# Patient Record
Sex: Male | Born: 1955 | Race: White | Hispanic: No | Marital: Married | Smoking: Never smoker
Health system: Southern US, Community
[De-identification: ages and names within clinical notes are randomized; demographics above are authoritative.]

## PROBLEM LIST (undated history)

## (undated) DIAGNOSIS — Z789 Other specified health status: Secondary | ICD-10-CM

## (undated) HISTORY — PX: NOSE SURGERY: SHX723

## (undated) HISTORY — PX: NASAL POLYP SURGERY: SHX186

---

## 2007-05-22 ENCOUNTER — Ambulatory Visit: Payer: Self-pay | Admitting: Emergency Medicine

## 2011-02-08 ENCOUNTER — Ambulatory Visit: Payer: Self-pay | Admitting: Internal Medicine

## 2017-07-06 DIAGNOSIS — H10412 Chronic giant papillary conjunctivitis, left eye: Secondary | ICD-10-CM | POA: Diagnosis not present

## 2017-10-07 DIAGNOSIS — Z0000 Encounter for general adult medical examination without abnormal findings: Secondary | ICD-10-CM | POA: Diagnosis not present

## 2017-10-07 DIAGNOSIS — Z23 Encounter for immunization: Secondary | ICD-10-CM | POA: Diagnosis not present

## 2017-10-07 DIAGNOSIS — Z1159 Encounter for screening for other viral diseases: Secondary | ICD-10-CM | POA: Diagnosis not present

## 2017-10-07 DIAGNOSIS — Z7689 Persons encountering health services in other specified circumstances: Secondary | ICD-10-CM | POA: Diagnosis not present

## 2017-10-10 ENCOUNTER — Encounter: Payer: Self-pay | Admitting: Emergency Medicine

## 2017-10-10 ENCOUNTER — Ambulatory Visit
Admission: EM | Admit: 2017-10-10 | Discharge: 2017-10-10 | Disposition: A | Discharge status: Home / Self Care | Payer: 59 | Attending: Emergency Medicine | Admitting: Emergency Medicine

## 2017-10-10 ENCOUNTER — Ambulatory Visit
Admission: RE | Admit: 2017-10-10 | Discharge: 2017-10-10 | Disposition: A | Discharge status: Home / Self Care | Payer: 59 | Source: Ambulatory Visit | Attending: Emergency Medicine | Admitting: Emergency Medicine

## 2017-10-10 DIAGNOSIS — S0990XA Unspecified injury of head, initial encounter: Secondary | ICD-10-CM

## 2017-10-10 DIAGNOSIS — S022XXA Fracture of nasal bones, initial encounter for closed fracture: Secondary | ICD-10-CM | POA: Diagnosis not present

## 2017-10-10 DIAGNOSIS — W108XXA Fall (on) (from) other stairs and steps, initial encounter: Secondary | ICD-10-CM | POA: Diagnosis not present

## 2017-10-10 DIAGNOSIS — S01112A Laceration without foreign body of left eyelid and periocular area, initial encounter: Secondary | ICD-10-CM

## 2017-10-10 DIAGNOSIS — W19XXXA Unspecified fall, initial encounter: Secondary | ICD-10-CM

## 2017-10-10 DIAGNOSIS — R51 Headache: Secondary | ICD-10-CM | POA: Diagnosis not present

## 2017-10-10 DIAGNOSIS — S0181XA Laceration without foreign body of other part of head, initial encounter: Secondary | ICD-10-CM | POA: Diagnosis not present

## 2017-10-10 DIAGNOSIS — X58XXXA Exposure to other specified factors, initial encounter: Secondary | ICD-10-CM | POA: Diagnosis not present

## 2017-10-10 NOTE — ED Provider Notes (Signed)
HPI  SUBJECTIVE:  Antonio Montgomery is a 61 y.o. male who presents with laceration to his left eyebrow.  States that he had 6-7 beers, and tripped on a trash bag that he was taking out, states that he fell 3 steps down and hit his head on the concrete.  This occurred approximately 20-30 last night.  Unknown loss of consciousness, but he did defecate on himself.  He reports a headache and states that he feels "unsteady on my feet".  He tried a Band-Aid with no improvement in symptoms.  No aggravating factors.  No nausea, vomiting, photophobia.  No chest pain, shortness of breath, palpitations, syncope causing his fall.  No face droop, arm or leg weakness, neck pain, visual changes.  No aphasia, dysarthria.  No nasal congestion, rhinorrhea.  No amnesia before the impact.  He is a past medical history negative for concussion, diabetes, hypertension, atrial fibrillation, arrhythmia, syncope, anticoagulant antiplatelet use, coagulopathy.  PMD.Gauger, Victoriano Lain, NP   History reviewed. No pertinent past medical history.  Past Surgical History:  Procedure Laterality Date  . NOSE SURGERY      History reviewed. No pertinent family history.  Social History  Substance Use Topics  . Smoking status: Never Smoker  . Smokeless tobacco: Never Used  . Alcohol use Yes    No current facility-administered medications for this encounter.  No current outpatient prescriptions on file.  No Known Allergies   ROS  As noted in HPI.   Physical Exam  BP (!) 137/96 (BP Location: Right Arm)   Pulse 66   Temp 98.3 F (36.8 C) (Oral)   Resp 16   Ht 5\' 10"  (1.778 m)   Wt 167 lb 1.7 oz (75.8 kg)   SpO2 100%   BMI 23.98 kg/m   Constitutional: Well developed, well nourished, no acute distress Eyes: PERRLA, EOMI, conjunctiva normal bilaterally.  No direct or consensual photophobia.  HENT: Normocephalic, 3 cm abrasion/laceration left eyebrow.,mucus membranes moist.  No raccoon eyes, hemotympanum,  nasal  congestion/rhinorrhea, negative battle sign    Respiratory: Normal inspiratory effort Cardiovascular: Normal rate regular rhythm no murmurs rubs or gallops. GI: nondistended skin: No rash, skin intact Musculoskeletal: no deformities Neurologic: Alert & oriented x 3, no focal neuro deficits, GCS 15.  Cranial nerves II through XII intact.  Finger-nose, heel shin within normal limits.  Tandem gait steady.  Romberg negative.   Psychiatric: Speech and behavior appropriate   ED Course   Medications - No data to display  Orders Placed This Encounter  Procedures  . CT HEAD WO CONTRAST    Standing Status:   Future    Number of Occurrences:   1    Standing Expiration Date:   01/10/2019    Order Specific Question:   Preferred imaging location?    Answer:   [facility]    Order Specific Question:   Radiology Contrast Protocol - do NOT remove file path    Answer:   \\[email or web address]    No results found for this or any previous visit (from the past 24 hour(s)). Ct Head Wo Contrast  Result Date: 10/10/2017 CLINICAL DATA:  Patient states that he fell down some steps while taking the trash last night around 10:30pm. Patient states that he hit his head on the cement. Patient has a laceration to his left eyebrow. Patient is unsure if he had LOC. EXAM: CT HEAD WITHOUT CONTRAST TECHNIQUE: Contiguous axial images were obtained from the base of the skull through the vertex without  intravenous contrast. COMPARISON:  None. FINDINGS: Brain: No evidence of acute infarction, hemorrhage, hydrocephalus, extra-axial collection or mass lesion/mass effect. Vascular: No hyperdense vessel or unexpected calcification. Skull: No skull fracture. Sinuses/Orbits: Laceration of the soft tissues along the medial margin of the left orbit, to the right of the base of the nose. No radiopaque foreign body. No injury to the left globe or postseptal orbit. Normal right globe and orbit. There changes  from prior sinus surgery. There is extensive sinus disease. The ethmoid air cells are opacified. There is moderate mucosal thickening in the frontal sinuses and maxillary sinuses with mild sphenoid sinus mucosal thickening. Clear mastoid air cells and middle ear cavities. Other: There is a fracture along the right anterior aspect of the nasal bone. No other visualized fractures. IMPRESSION: 1. No intracranial abnormality. 2. Nondisplaced fracture of the right anterior nasal bone. 3. Extensive sinus mucosal thickening. Status post prior sinus surgery. Electronically Signed   By: Lajean Manes M.D.   On: 10/10/2017 11:28    ED Clinical Impression  Facial laceration, initial encounter  Fall, initial encounter  Injury of head, initial encounter   ED Assessment/Plan  Procedure note: Scrubbed laceration with wound cleanse solution and gauze.  Then irrigated it.  Using 0.5 cc of 2% lidocaine with epi for local infiltration with adequate anesthesia.  Placed 4 6-0 Prolene interrupted sutures.  Placed bacitracin and dressing.  Patient tolerated procedure well.  Advised to come back here in 10 days for suture removal, sooner for any signs of infection.  Will obtain outpatient head CT because he was intoxicated, had a questionable loss of consciousness.  He also reports unsteadiness although he is neurologically intact.  Imaging independently reviewed.  No intracranial abnormality, nondisplaced fracture of the right anterior nasal bone, extensive sinus mucosal disease/thickening.  See radiology report for details.    Discussed  imaging, MDM, plan and followup with patient.  advised him to follow-up with his ENT physician for the sinus disease.   Discussed sn/sx that should prompt return to the ED. patient agrees with plan.   No orders of the defined types were placed in this encounter.   *This clinic note was created using Dragon dictation software. Therefore, there may be occasional mistakes despite  careful proofreading.   ?   Melynda Ripple, MD 10/10/17 [phone]

## 2017-10-10 NOTE — Discharge Instructions (Signed)
Wait at the radiology department until the head CT is read.  Return here in 10 days for suture removal.  Keep it covered and clean and dry for the next 48-72 hours, then you can leave it open to air.  Apply bacitracin as needed.  Return here sooner for any signs of infection.  Go immediately to the ER if your headache worsens, you have trouble talking, walking, arm or leg weakness, facial droop, visual changes, lights bothering your eyes or for other concerns

## 2017-10-10 NOTE — ED Triage Notes (Signed)
Patient states that he fell down some steps while taking the trash last night around 10:30pm.  Patient states that he hit his head on the cement.  Patient has a laceration to his left eyebrow.  Patient is unsure if he had LOC.

## 2017-10-12 ENCOUNTER — Telehealth: Payer: Self-pay

## 2017-10-12 NOTE — ED Notes (Signed)
CT Head W/O Contrast 70450 prior authorization obtained. Approval Dates 10/10/2017-11/24/2017. PA # [id number]

## 2017-10-20 ENCOUNTER — Other Ambulatory Visit: Payer: Self-pay

## 2017-10-20 ENCOUNTER — Ambulatory Visit
Admission: EM | Admit: 2017-10-20 | Discharge: 2017-10-20 | Disposition: A | Discharge status: Home / Self Care | Payer: 59 | Attending: Family Medicine | Admitting: Family Medicine

## 2017-10-20 ENCOUNTER — Encounter: Payer: Self-pay | Admitting: Emergency Medicine

## 2017-10-20 DIAGNOSIS — S01112D Laceration without foreign body of left eyelid and periocular area, subsequent encounter: Secondary | ICD-10-CM | POA: Diagnosis not present

## 2017-10-20 DIAGNOSIS — Z4802 Encounter for removal of sutures: Secondary | ICD-10-CM | POA: Diagnosis not present

## 2017-10-20 HISTORY — DX: Other specified health status: Z78.9

## 2017-10-20 NOTE — ED Provider Notes (Signed)
61 year old male presents for suture removal.  Patient's wound was repaired on 11/3.  He endorses no complications.  No drainage from the wound.  Sutures removed today without difficulty.  Girard, [place], [place] 10/20/17 [phone]

## 2017-10-20 NOTE — ED Triage Notes (Signed)
Patient in today for suture removal left eyebrow. DOI 10/09/17.

## 2018-01-11 DIAGNOSIS — Z1211 Encounter for screening for malignant neoplasm of colon: Secondary | ICD-10-CM | POA: Diagnosis not present

## 2018-01-22 DIAGNOSIS — K620 Anal polyp: Secondary | ICD-10-CM | POA: Diagnosis not present

## 2018-01-22 DIAGNOSIS — K635 Polyp of colon: Secondary | ICD-10-CM | POA: Diagnosis not present

## 2018-01-22 DIAGNOSIS — K621 Rectal polyp: Secondary | ICD-10-CM | POA: Diagnosis not present

## 2018-01-22 DIAGNOSIS — Z1211 Encounter for screening for malignant neoplasm of colon: Secondary | ICD-10-CM | POA: Diagnosis not present

## 2018-01-22 DIAGNOSIS — D126 Benign neoplasm of colon, unspecified: Secondary | ICD-10-CM | POA: Diagnosis not present

## 2018-02-10 DIAGNOSIS — J339 Nasal polyp, unspecified: Secondary | ICD-10-CM | POA: Diagnosis not present

## 2018-02-10 DIAGNOSIS — J3489 Other specified disorders of nose and nasal sinuses: Secondary | ICD-10-CM | POA: Diagnosis not present

## 2018-02-10 DIAGNOSIS — J301 Allergic rhinitis due to pollen: Secondary | ICD-10-CM | POA: Diagnosis not present

## 2018-02-25 DIAGNOSIS — J3489 Other specified disorders of nose and nasal sinuses: Secondary | ICD-10-CM | POA: Diagnosis not present

## 2018-02-25 DIAGNOSIS — J301 Allergic rhinitis due to pollen: Secondary | ICD-10-CM | POA: Diagnosis not present

## 2018-02-25 DIAGNOSIS — J339 Nasal polyp, unspecified: Secondary | ICD-10-CM | POA: Diagnosis not present

## 2018-03-16 DIAGNOSIS — J301 Allergic rhinitis due to pollen: Secondary | ICD-10-CM | POA: Diagnosis not present

## 2020-05-27 ENCOUNTER — Other Ambulatory Visit: Payer: Self-pay

## 2020-05-27 ENCOUNTER — Ambulatory Visit
Admission: EM | Admit: 2020-05-27 | Discharge: 2020-05-27 | Disposition: A | Discharge status: Home / Self Care | Payer: BC Managed Care – PPO | Attending: Emergency Medicine | Admitting: Emergency Medicine

## 2020-05-27 DIAGNOSIS — W57XXXA Bitten or stung by nonvenomous insect and other nonvenomous arthropods, initial encounter: Secondary | ICD-10-CM | POA: Diagnosis not present

## 2020-05-27 DIAGNOSIS — T63441A Toxic effect of venom of bees, accidental (unintentional), initial encounter: Secondary | ICD-10-CM

## 2020-05-27 DIAGNOSIS — T7840XA Allergy, unspecified, initial encounter: Secondary | ICD-10-CM | POA: Diagnosis not present

## 2020-05-27 MED ORDER — TETANUS-DIPHTH-ACELL PERTUSSIS 5-2.5-18.5 LF-MCG/0.5 IM SUSP: 0.5000 mL | Freq: Once | INTRAMUSCULAR | Status: DC

## 2020-05-27 MED ORDER — PREDNISONE 10 MG (21) PO TBPK: ORAL_TABLET | ORAL | 0 refills | Status: AC

## 2020-05-27 NOTE — Discharge Instructions (Addendum)
-  Take an over-the-counter allergy medication such as Zyrtec or Xyzal every day -Take an over-the-counter H2 receptor blocker such as Pepcid or Zantac every day -Cold compresses to the area -Monitor the redness and swelling of the area. If the redness and swelling does not get better with all the above medications and your prednisone, follow-up to rule out infection.

## 2020-05-27 NOTE — ED Provider Notes (Addendum)
[facility] - [place], [place]   Name: Antonio Montgomery DOB: 23-Feb-1956 MRN: [id number] CSN: [id number] PCP: Sallee Lange, NP  Arrival date and time:  05/27/20 0816  Chief Complaint:  Insect Bite   NOTE: Prior to seeing the patient today, I have reviewed the triage nursing documentation and vital signs. Clinical staff has updated patient's PMH/PSHx, current medication list, and drug allergies/intolerances to ensure comprehensive history available to assist in medical decision making.   History:   HPI: Antonio Montgomery is a 64 y.o. male who presents today with complaints of insect bites.  He was stung by yellow jackets on Friday after missing a nest.  He was stung in both hands, arms, and his right side.  He states he never had another reaction to yellowjacket before, but this time these bites caused an increase amount of swelling and itching.  He denies any shortness of breath, chest pain, problems breathing.  He has tried over-the-counter medications such as Benadryl, topical corticosteroid cream, and calmoseptine lotion.  He states that all these things give him relief, but for a very short period of time.  Last tetanus shot was in 2019 via MyChart.   Past Medical History:  Diagnosis Date  . No known health problems     Past Surgical History:  Procedure Laterality Date  . NASAL POLYP SURGERY    . NOSE SURGERY      Family History  Problem Relation Age of Onset  . Hypertension Mother   . Thyroid disease Mother   . Hypertension Father     Social History   Tobacco Use  . Smoking status: Never Smoker  . Smokeless tobacco: Never Used  Vaping Use  . Vaping Use: Never used  Substance Use Topics  . Alcohol use: Yes    Alcohol/week: 15.0 standard drinks    Types: 15 Cans of beer per week  . Drug use: No    There are no problems to display for this patient.   Home Medications:    No outpatient medications have been marked as taking for the 05/27/20  encounter [facility] Encounter).    Allergies:   Patient has no known allergies.  Review of Systems (ROS): Review of Systems  Constitutional: Negative for chills and fever.  Eyes: Negative for pain and visual disturbance.  Respiratory: Negative for cough and shortness of breath.   Cardiovascular: Negative for chest pain and palpitations.  Skin: Positive for color change and rash.  All other systems reviewed and are negative.    Vital Signs: Today's Vitals   05/27/20 0838 05/27/20 0839 05/27/20 0858  BP:  (!) 148/81   Pulse:  (!) 59   Resp:  18   Temp:  97.9 F (36.6 C)   TempSrc:  Oral   SpO2:  100%   Weight: 160 lb (72.6 kg)    Height: 5\' 10"  (1.778 m)    PainSc: 0-No pain  0-No pain    Physical Exam: Physical Exam Constitutional:      Appearance: Normal appearance.  Cardiovascular:     Pulses: Normal pulses.          Radial pulses are 2+ on the right side and 2+ on the left side.     Heart sounds: Normal heart sounds.  Pulmonary:     Effort: Pulmonary effort is normal.     Breath sounds: Normal breath sounds.  Skin:    Capillary Refill: Capillary refill takes less than 2 seconds.  Findings: Rash present.       Neurological:     Mental Status: He is alert.      Urgent Care Treatments / Results:   LABS: PLEASE NOTE: all labs that were ordered this encounter are listed, however only abnormal results are displayed. Labs Reviewed - No data to display  EKG: -None  RADIOLOGY: No results found.  PROCEDURES: Procedures  MEDICATIONS RECEIVED THIS VISIT: Medications - No data to display  PERTINENT CLINICAL COURSE NOTES/UPDATES:   Initial Impression / Assessment and Plan / Urgent Care Course:  Pertinent labs & imaging results that were available during my care of the patient were personally reviewed by me and considered in my medical decision making (see lab/imaging section of note for values and interpretations).  Antonio Montgomery is a 64 y.o. male  who presents to [facility] today with complaints of a rash, diagnosed with an insect bite, and treated as such with the medications below. NP and patient reviewed discharge instructions below during visit.   Patient is well appearing overall in clinic today. He does not appear to be in any acute distress. Presenting symptoms (see HPI) and exam as documented above.   I have reviewed the follow up and strict return precautions for any new or worsening symptoms. Patient is aware of symptoms that would be deemed urgent/emergent, and would thus require further evaluation either here or in the emergency department. At the time of discharge, he verbalized understanding and consent with the discharge plan as it was reviewed with him. All questions were fielded by provider and/or clinic staff prior to patient discharge.    Final Clinical Impressions / Urgent Care Diagnoses:   Final diagnoses:  Insect bite, unspecified site, initial encounter  Bee sting reaction, accidental or unintentional, initial encounter  Allergic reaction, initial encounter    New Prescriptions:  [place] Controlled Substance Registry consulted? Not Applicable  Meds ordered this encounter  Medications  . DISCONTD: Tdap (BOOSTRIX) injection 0.5 mL  . predniSONE (STERAPRED UNI-PAK 21 TAB) 10 MG (21) TBPK tablet    Sig: Take as instructed on package (60, 50, 40, 30, 20, 10)    Dispense:  1 each    Refill:  0      Discharge Instructions     -Take an over-the-counter allergy medication such as Zyrtec or Xyzal every day -Take an over-the-counter H2 receptor blocker such as Pepcid or Zantac every day -Cold compresses to the area -Monitor the redness and swelling of the area. If the redness and swelling does not get better with all the above medications and your prednisone, follow-up to rule out infection.    Recommended Follow up Care:  Patient encouraged to follow up with the following provider within the specified  time frame, or sooner as dictated by the severity of his symptoms. As always, he was instructed that for any urgent/emergent care needs, he should seek care either here or in the emergency department for more immediate evaluation.   Gertie Baron, DNP, NP-c    Gertie Baron, NP 05/27/20 6962    Gertie Baron, NP 06/09/20 9528

## 2020-05-27 NOTE — ED Triage Notes (Signed)
Patient states that he was stung by yellow jackets on Friday on his hand and axilla. Patient states that he didn't know he was sitting over a nest. Reports that the area has been itchy.

## 2020-08-13 ENCOUNTER — Ambulatory Visit
Admission: EM | Admit: 2020-08-13 | Discharge: 2020-08-13 | Disposition: A | Discharge status: Home / Self Care | Payer: BC Managed Care – PPO | Attending: Physician Assistant | Admitting: Physician Assistant

## 2020-08-13 ENCOUNTER — Ambulatory Visit (INDEPENDENT_AMBULATORY_CARE_PROVIDER_SITE_OTHER): Payer: BC Managed Care – PPO

## 2020-08-13 ENCOUNTER — Other Ambulatory Visit: Payer: Self-pay

## 2020-08-13 DIAGNOSIS — B349 Viral infection, unspecified: Secondary | ICD-10-CM | POA: Diagnosis not present

## 2020-08-13 DIAGNOSIS — R52 Pain, unspecified: Secondary | ICD-10-CM | POA: Diagnosis not present

## 2020-08-13 DIAGNOSIS — J029 Acute pharyngitis, unspecified: Secondary | ICD-10-CM | POA: Diagnosis not present

## 2020-08-13 DIAGNOSIS — R5383 Other fatigue: Secondary | ICD-10-CM

## 2020-08-13 DIAGNOSIS — R05 Cough: Secondary | ICD-10-CM | POA: Diagnosis not present

## 2020-08-13 DIAGNOSIS — U071 COVID-19: Secondary | ICD-10-CM | POA: Insufficient documentation

## 2020-08-13 LAB — URINALYSIS, COMPLETE (UACMP) WITH MICROSCOPIC
Glucose, UA: NEGATIVE mg/dL
Hgb urine dipstick: NEGATIVE
Leukocytes,Ua: NEGATIVE
Nitrite: NEGATIVE
Protein, ur: 100 mg/dL — AB
Specific Gravity, Urine: >1.03 — ABNORMAL HIGH (ref 1.005–1.030)
Squamous Epithelial / HPF: NONE SEEN (ref 0–5)
WBC, UA: NONE SEEN WBC/hpf (ref 0–5)
pH: 5.5 (ref 5.0–8.0)

## 2020-08-13 MED ORDER — ACETAMINOPHEN 325 MG PO TABS: 650.0000 mg | ORAL_TABLET | Freq: Four times a day (QID) | ORAL | 0 refills | Status: AC | PRN

## 2020-08-13 MED ORDER — BENZONATATE 100 MG PO CAPS: 100.0000 mg | ORAL_CAPSULE | Freq: Three times a day (TID) | ORAL | 0 refills | Status: AC

## 2020-08-13 MED ORDER — GUAIFENESIN ER 600 MG PO TB12: 600.0000 mg | ORAL_TABLET | Freq: Two times a day (BID) | ORAL | 0 refills | Status: AC

## 2020-08-13 NOTE — ED Triage Notes (Signed)
Pt with 5 days of generalized body aches, extreme fatigue and cough. "I feel like I have the flu."

## 2020-08-13 NOTE — ED Provider Notes (Signed)
[facility]    CSN: [id number] Arrival date & time: 08/13/20  1019      History   Chief Complaint Chief Complaint  Patient presents with  . Generalized Body Aches  . Cough    HPI Antonio Montgomery is a 64 y.o. male.   Patient reports for cough, body aches, fatigue and sore throat.  Reports symptoms started about 5 days ago.  He reports cough is productive occasionally with white sputum.  Denies difficulty breathing or chest pain.  There is generalized fatigue.  Reports body aches, with an achy back.  Denies fever or chills at home.  Denies nausea, vomiting, abdominal pain or diarrhea.  Reports decreased appetite however has been drinking per usual.  Is concerned as he was 163 pounds on his home scale and 151 here, reports his weight has been stable.  Reports Friday evening he did" wake up 9 times" to urinate throughout the night.  He has had about 4 episodes of nightly urination since then.  He reports typically he will urinate once in the evening.  He reports daytime urination is fairly stable.  Denies painful urination or urgency.  Reports normal volume urine with each episode.  Reports his water intake is been per usual.  No history of issues with sugar.  Did eat normal amounts of sugar on Friday.  No known sick contacts.  Patient has a primary care.        Past Medical History:  Diagnosis Date  . No known health problems     There are no problems to display for this patient.   Past Surgical History:  Procedure Laterality Date  . NASAL POLYP SURGERY    . NOSE SURGERY         Home Medications    Prior to Admission medications   Medication Sig Start Date End Date Taking? Authorizing Provider  acetaminophen (TYLENOL) 325 MG tablet Take 2 tablets (650 mg total) by mouth every 6 (six) hours as needed. 08/13/20   Mikesha Migliaccio, Marguerita Beards, PA-C  benzonatate (TESSALON) 100 MG capsule Take 1-2 capsules (100-200 mg total) by mouth 3 (three) times daily for 10 days. 08/13/20  08/23/20  Hosanna Betley, Marguerita Beards, PA-C  guaiFENesin (MUCINEX) 600 MG 12 hr tablet Take 1 tablet (600 mg total) by mouth 2 (two) times daily for 7 days. 08/13/20 08/20/20  Mylasia Vorhees, Marguerita Beards, PA-C  predniSONE (STERAPRED UNI-PAK 21 TAB) 10 MG (21) TBPK tablet Take as instructed on package (60, 50, 40, 30, 20, 10) 05/27/20   Gertie Baron, NP    Family History Family History  Problem Relation Age of Onset  . Hypertension Mother   . Thyroid disease Mother   . Hypertension Father     Social History Social History   Tobacco Use  . Smoking status: Never Smoker  . Smokeless tobacco: Never Used  Vaping Use  . Vaping Use: Never used  Substance Use Topics  . Alcohol use: Yes    Alcohol/week: 15.0 standard drinks    Types: 15 Cans of beer per week  . Drug use: No     Allergies   Patient has no known allergies.   Review of Systems Review of Systems   Physical Exam Triage Vital Signs ED Triage Vitals  Enc Vitals Group     BP 08/13/20 1101 128/88     Pulse Rate 08/13/20 1101 68     Resp 08/13/20 1101 18     Temp 08/13/20 1101 98 F (36.7 C)  Temp Source 08/13/20 1101 Oral     SpO2 08/13/20 1101 99 %     Weight 08/13/20 1100 151 lb (68.5 kg)     Height 08/13/20 1100 5\' 10"  (1.778 m)     Head Circumference --      Peak Flow --      Pain Score 08/13/20 1100 5     Pain Loc --      Pain Edu? --      Excl. in [place]? --    No data found.  Updated Vital Signs BP 128/88 (BP Location: Left Arm)   Pulse 68   Temp 98 F (36.7 C) (Oral)   Resp 18   Ht 5\' 10"  (1.778 m)   Wt 151 lb (68.5 kg)   SpO2 99%   BMI 21.67 kg/m   Visual Acuity Right Eye Distance:   Left Eye Distance:   Bilateral Distance:    Right Eye Near:   Left Eye Near:    Bilateral Near:     Physical Exam Vitals and nursing note reviewed.  Constitutional:      General: He is not in acute distress.    Appearance: He is well-developed. He is not ill-appearing, toxic-appearing or diaphoretic.  HENT:     Head:  Normocephalic and atraumatic.     Right Ear: Tympanic membrane normal.     Left Ear: Tympanic membrane normal.     Nose: Congestion present.     Mouth/Throat:     Mouth: Mucous membranes are moist.     Comments: Mild postnasal drip Eyes:     Conjunctiva/sclera: Conjunctivae normal.  Cardiovascular:     Rate and Rhythm: Normal rate and regular rhythm.     Heart sounds: No murmur heard.   Pulmonary:     Effort: Pulmonary effort is normal. No respiratory distress.     Breath sounds: Normal breath sounds. No rhonchi or rales.     Comments: Moving air well through fields, 1 expiratory wheeze on the right anterior field not appreciable elsewhere. Abdominal:     Palpations: Abdomen is soft.     Tenderness: There is no abdominal tenderness.  Musculoskeletal:     Cervical back: Neck supple.     Right lower leg: No edema.     Left lower leg: No edema.  Skin:    General: Skin is warm and dry.     Findings: No rash.  Neurological:     General: No focal deficit present.     Mental Status: He is alert and oriented to person, place, and time.      UC Treatments / Results  Labs (all labs ordered are listed, but only abnormal results are displayed) Labs Reviewed  URINALYSIS, COMPLETE (UACMP) WITH MICROSCOPIC - Abnormal; Notable for the following components:      Result Value   Color, Urine AMBER (*)    APPearance HAZY (*)    Specific Gravity, Urine >1.030 (*)    Bilirubin Urine SMALL (*)    Ketones, ur TRACE (*)    Protein, ur 100 (*)    Bacteria, UA FEW (*)    All other components within normal limits  SARS CORONAVIRUS 2 (TAT 6-24 HRS)  URINE CULTURE    EKG   Radiology DG Chest 2 View  Result Date: 08/13/2020 CLINICAL DATA:  Productive cough, body aches, fatigue. EXAM: CHEST - 2 VIEW COMPARISON:  None. FINDINGS: Heart size and mediastinal contours are within normal limits. Lungs are clear. No pleural effusion. No  acute appearing osseous abnormality. IMPRESSION: No active  cardiopulmonary disease. No evidence of pneumonia. Electronically Signed   By: Franki Cabot M.D.   On: 08/13/2020 11:42    Procedures Procedures (including critical care time)  Medications Ordered in UC Medications - No data to display  Initial Impression / Assessment and Plan / UC Course  I have reviewed the triage vital signs and the nursing notes.  Pertinent labs & imaging results that were available during my care of the patient were reviewed by me and considered in my medical decision making (see chart for details).     #Viral illness Patient is a 64 year old otherwise healthy male presenting with viral syndrome symptoms with cough.  Normal vital signs here and reassuring exam.  Chest x-ray clear.  Urinalysis without an obvious cause of frequent urination/nocturia.  Likely this is in the setting of viral illness and fluid intake.  No glucosuria or evidence of infection.  Will send Covid test.  Treat him symptomatically and have him follow-up with his primary care to discuss nocturia if this is persistent.  Discussed return, follow-up and emergency department cautions.  Patient verbalized understanding agreement with plan of care. Final Clinical Impressions(s) / UC Diagnoses   Final diagnoses:  Viral illness     Discharge Instructions     Chest x-ray was clear, and your urine did not show any obvious causes  Take the medications as prescribed  If symptoms are acutely worsening with shortness of breath, high fevers, severe abdominal pain or other concerning symptoms return or go to the emergency department  Continue to hydrate well.  Follow-up with your primary care provider over the next few days to discuss the frequent urination.  If your Covid-19 test is positive, you will receive a phone call from [facility] regarding your results. Negative test results are not called. Both positive and negative results area always visible on MyChart. If you do not have a MyChart  account, sign up instructions are in your discharge papers.   Persons who are directed to care for themselves at home may discontinue isolation under the following conditions:  . At least 10 days have passed since symptom onset and . At least 24 hours have passed without running a fever (this means without the use of fever-reducing medications) and . Other symptoms have improved.  Persons infected with COVID-19 who never develop symptoms may discontinue isolation and other precautions 10 days after the date of their first positive COVID-19 test.       ED Prescriptions    Medication Sig Dispense Auth. Provider   benzonatate (TESSALON) 100 MG capsule Take 1-2 capsules (100-200 mg total) by mouth 3 (three) times daily for 10 days. 60 capsule Tylan Kinn, Marguerita Beards, PA-C   guaiFENesin (MUCINEX) 600 MG 12 hr tablet Take 1 tablet (600 mg total) by mouth 2 (two) times daily for 7 days. 14 tablet Tiffani Kadow, Marguerita Beards, PA-C   acetaminophen (TYLENOL) 325 MG tablet Take 2 tablets (650 mg total) by mouth every 6 (six) hours as needed. 30 tablet Kimberley Speece, Marguerita Beards, PA-C     PDMP not reviewed this encounter.   Purnell Shoemaker, PA-C 08/13/20 1339

## 2020-08-13 NOTE — Discharge Instructions (Addendum)
Chest x-ray was clear, and your urine did not show any obvious causes  Take the medications as prescribed  If symptoms are acutely worsening with shortness of breath, high fevers, severe abdominal pain or other concerning symptoms return or go to the emergency department  Continue to hydrate well.  Follow-up with your primary care provider over the next few days to discuss the frequent urination.  If your Covid-19 test is positive, you will receive a phone call from [facility] regarding your results. Negative test results are not called. Both positive and negative results area always visible on MyChart. If you do not have a MyChart account, sign up instructions are in your discharge papers.   Persons who are directed to care for themselves at home may discontinue isolation under the following conditions:   At least 10 days have passed since symptom onset and  At least 24 hours have passed without running a fever (this means without the use of fever-reducing medications) and  Other symptoms have improved.  Persons infected with COVID-19 who never develop symptoms may discontinue isolation and other precautions 10 days after the date of their first positive COVID-19 test.

## 2020-08-14 LAB — SARS CORONAVIRUS 2 (TAT 6-24 HRS): SARS Coronavirus 2: POSITIVE — AB

## 2020-08-15 ENCOUNTER — Encounter: Payer: Self-pay | Admitting: Family

## 2020-08-15 LAB — URINE CULTURE: Culture: NO GROWTH

## 2022-06-06 IMAGING — CR DG CHEST 2V
2 series · 2 of 2 positions shown · non-contrast
Comparison: None.

CLINICAL DATA: Productive cough, body aches, fatigue.

EXAM:
CHEST - 2 VIEW

[chest pa]
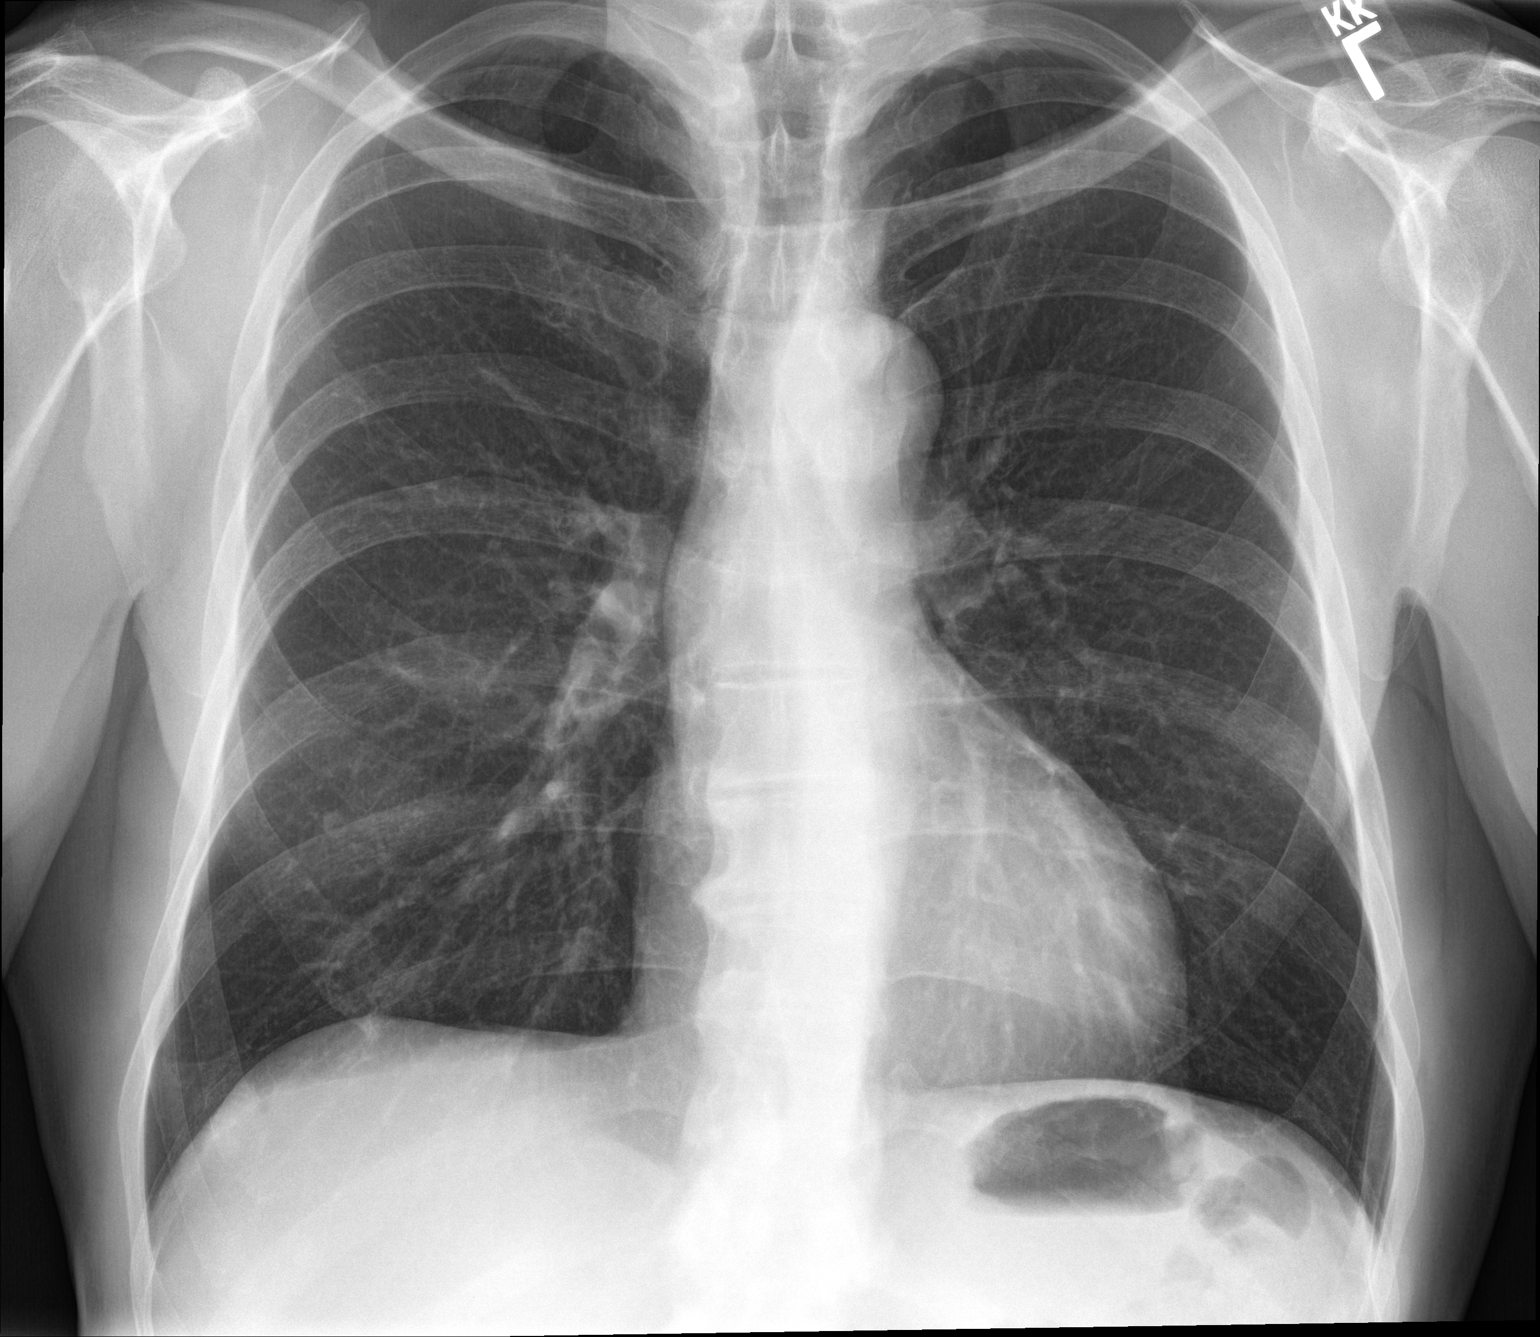

[chest lat]
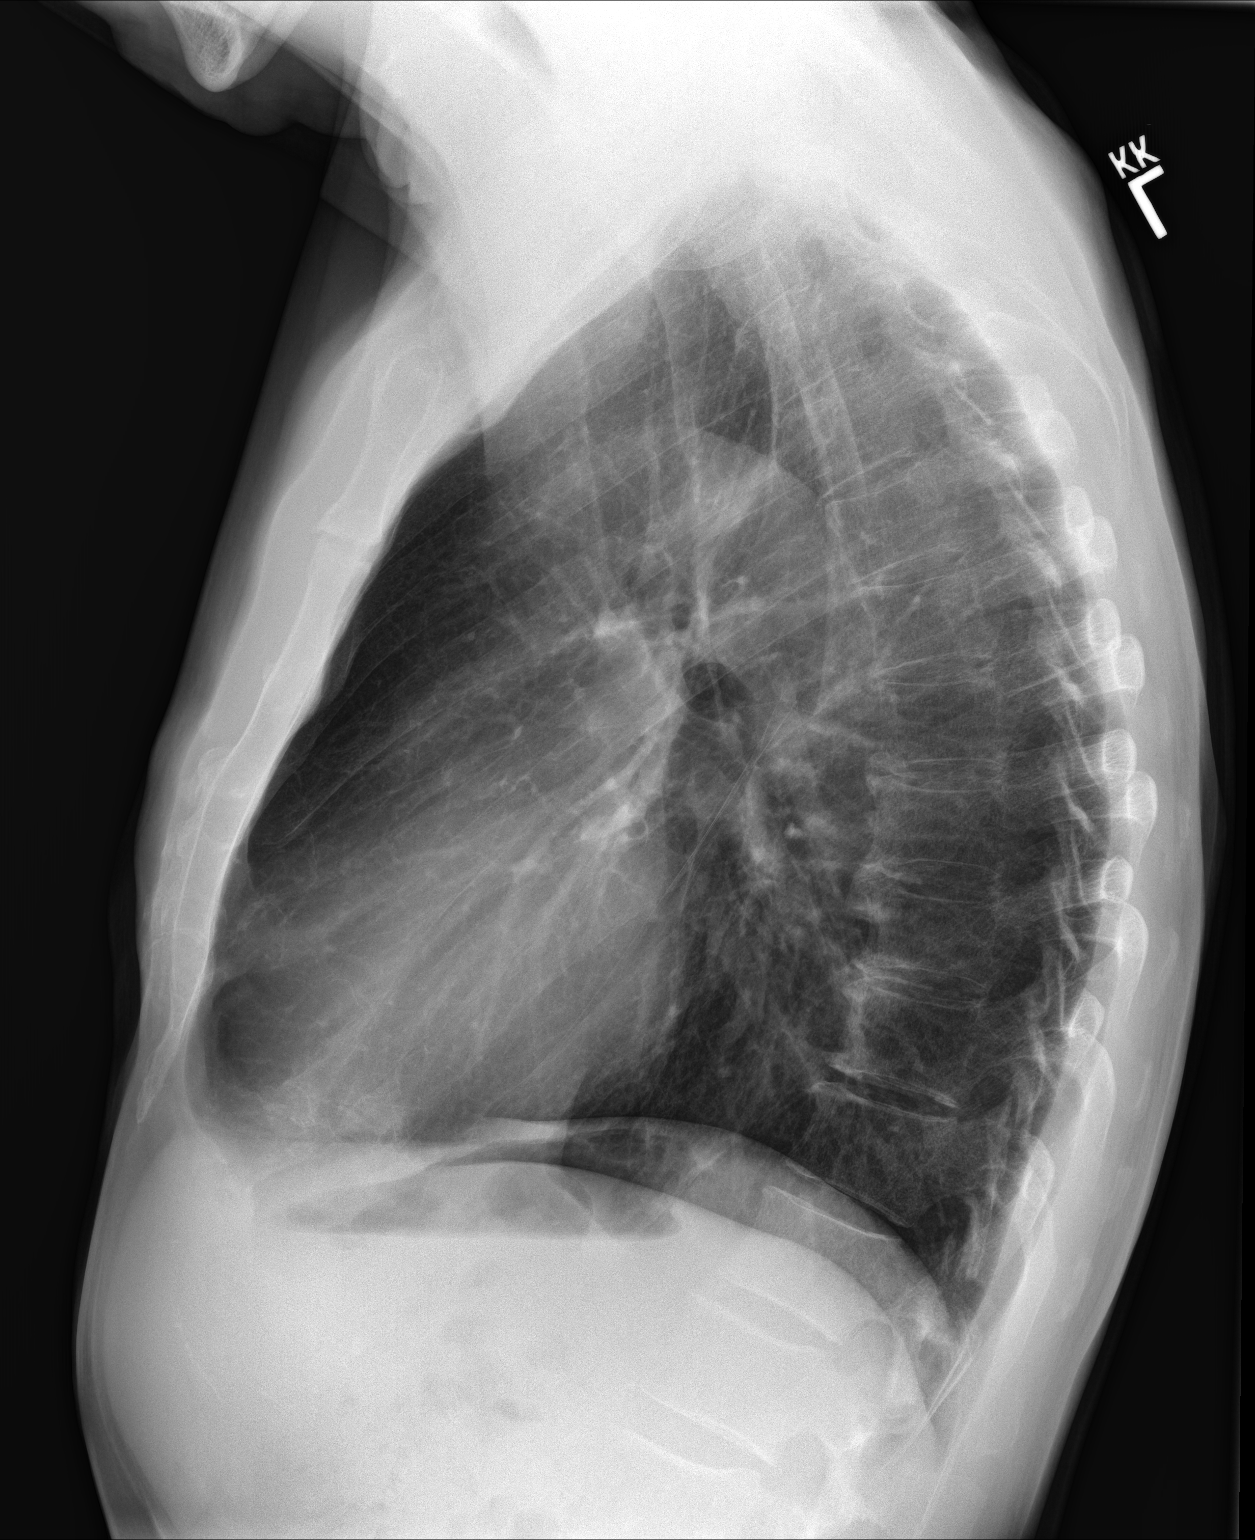

[2 of 2 positions shown; findings below may reference images not displayed]

FINDINGS: Heart size and mediastinal contours are within normal limits. Lungs
are clear. No pleural effusion. No acute appearing osseous
abnormality.
IMPRESSION: No active cardiopulmonary disease. No evidence of pneumonia.
